# Patient Record
Sex: Male | Born: 1970 | Race: White | Hispanic: No | Marital: Single | Smoking: Current every day smoker
Health system: Southern US, Community
[De-identification: ages and names within clinical notes are randomized; demographics above are authoritative.]

## PROBLEM LIST (undated history)

## (undated) DIAGNOSIS — F209 Schizophrenia, unspecified: Secondary | ICD-10-CM

## (undated) DIAGNOSIS — B1910 Unspecified viral hepatitis B without hepatic coma: Secondary | ICD-10-CM

## (undated) DIAGNOSIS — M419 Scoliosis, unspecified: Secondary | ICD-10-CM

## (undated) DIAGNOSIS — I10 Essential (primary) hypertension: Secondary | ICD-10-CM

## (undated) DIAGNOSIS — G894 Chronic pain syndrome: Secondary | ICD-10-CM

## (undated) DIAGNOSIS — G40909 Epilepsy, unspecified, not intractable, without status epilepticus: Secondary | ICD-10-CM

## (undated) DIAGNOSIS — K7460 Unspecified cirrhosis of liver: Secondary | ICD-10-CM

## (undated) DIAGNOSIS — M4300 Spondylolysis, site unspecified: Secondary | ICD-10-CM

## (undated) HISTORY — PX: MANDIBLE FRACTURE SURGERY: SHX706

## (undated) HISTORY — DX: Unspecified cirrhosis of liver: K74.60

## (undated) HISTORY — DX: Scoliosis, unspecified: M41.9

## (undated) HISTORY — DX: Unspecified viral hepatitis B without hepatic coma: B19.10

## (undated) HISTORY — DX: Essential (primary) hypertension: I10

## (undated) HISTORY — DX: Chronic pain syndrome: G89.4

## (undated) HISTORY — DX: Epilepsy, unspecified, not intractable, without status epilepticus: G40.909

## (undated) HISTORY — DX: Spondylolysis, site unspecified: M43.00

## (undated) HISTORY — DX: Schizophrenia, unspecified: F20.9

---

## 2016-05-25 ENCOUNTER — Other Ambulatory Visit (HOSPITAL_COMMUNITY): Payer: Self-pay | Admitting: Nurse Practitioner

## 2016-05-25 DIAGNOSIS — B181 Chronic viral hepatitis B without delta-agent: Secondary | ICD-10-CM

## 2016-05-31 ENCOUNTER — Encounter: Payer: Self-pay | Admitting: Gastroenterology

## 2016-07-12 ENCOUNTER — Ambulatory Visit (HOSPITAL_COMMUNITY): Payer: Medicare Other

## 2016-08-02 ENCOUNTER — Ambulatory Visit: Payer: Self-pay | Admitting: Gastroenterology

## 2017-06-29 ENCOUNTER — Emergency Department (HOSPITAL_COMMUNITY)
Admission: EM | Admit: 2017-06-29 | Discharge: 2017-06-30 | Disposition: A | Discharge status: Other Acute Inpatient Hospital | Payer: Self-pay | Attending: Emergency Medicine | Admitting: Emergency Medicine

## 2017-06-29 ENCOUNTER — Emergency Department (HOSPITAL_COMMUNITY): Payer: Self-pay

## 2017-06-29 ENCOUNTER — Encounter (HOSPITAL_COMMUNITY): Payer: Self-pay | Admitting: Emergency Medicine

## 2017-06-29 DIAGNOSIS — Z046 Encounter for general psychiatric examination, requested by authority: Secondary | ICD-10-CM | POA: Insufficient documentation

## 2017-06-29 DIAGNOSIS — Z79899 Other long term (current) drug therapy: Secondary | ICD-10-CM | POA: Insufficient documentation

## 2017-06-29 DIAGNOSIS — R42 Dizziness and giddiness: Secondary | ICD-10-CM | POA: Insufficient documentation

## 2017-06-29 DIAGNOSIS — R51 Headache: Secondary | ICD-10-CM | POA: Insufficient documentation

## 2017-06-29 DIAGNOSIS — I10 Essential (primary) hypertension: Secondary | ICD-10-CM | POA: Insufficient documentation

## 2017-06-29 DIAGNOSIS — R450 Nervousness: Secondary | ICD-10-CM | POA: Insufficient documentation

## 2017-06-29 DIAGNOSIS — F17210 Nicotine dependence, cigarettes, uncomplicated: Secondary | ICD-10-CM | POA: Insufficient documentation

## 2017-06-29 DIAGNOSIS — R41840 Attention and concentration deficit: Secondary | ICD-10-CM | POA: Insufficient documentation

## 2017-06-29 DIAGNOSIS — R45851 Suicidal ideations: Secondary | ICD-10-CM | POA: Insufficient documentation

## 2017-06-29 LAB — COMPREHENSIVE METABOLIC PANEL
ALBUMIN: 3.7 g/dL (ref 3.5–5.0)
ALT: 29 U/L (ref 17–63)
ANION GAP: 6 (ref 5–15)
AST: 38 U/L (ref 15–41)
Alkaline Phosphatase: 91 U/L (ref 38–126)
BILIRUBIN TOTAL: 0.9 mg/dL (ref 0.3–1.2)
BUN: 10 mg/dL (ref 6–20)
CHLORIDE: 107 mmol/L (ref 101–111)
CO2: 25 mmol/L (ref 22–32)
Calcium: 9.2 mg/dL (ref 8.9–10.3)
Creatinine, Ser: 0.72 mg/dL (ref 0.61–1.24)
GFR calc non Af Amer: >60 mL/min (ref >60)
GLUCOSE: 120 mg/dL — AB (ref 65–99)
Potassium: 3.3 mmol/L — ABNORMAL LOW (ref 3.5–5.1)
SODIUM: 138 mmol/L (ref 135–145)
Total Protein: 6.6 g/dL (ref 6.5–8.1)

## 2017-06-29 LAB — URINALYSIS, ROUTINE W REFLEX MICROSCOPIC
BILIRUBIN URINE: NEGATIVE
GLUCOSE, UA: NEGATIVE mg/dL
HGB URINE DIPSTICK: NEGATIVE
KETONES UR: NEGATIVE mg/dL
Leukocytes, UA: NEGATIVE
Nitrite: NEGATIVE
PROTEIN: NEGATIVE mg/dL
Specific Gravity, Urine: 1.012 (ref 1.005–1.030)
pH: 6 (ref 5.0–8.0)

## 2017-06-29 LAB — RAPID URINE DRUG SCREEN, HOSP PERFORMED
AMPHETAMINES: NOT DETECTED
BARBITURATES: NOT DETECTED
BENZODIAZEPINES: NOT DETECTED
COCAINE: NOT DETECTED
Opiates: NOT DETECTED
TETRAHYDROCANNABINOL: NOT DETECTED

## 2017-06-29 LAB — AMMONIA: Ammonia: 60 umol/L — ABNORMAL HIGH (ref 9–35)

## 2017-06-29 NOTE — ED Triage Notes (Signed)
Pt states he has been having suicidal thoughts x 2 days. His plan is to take a bunch of pill or cut his wrists.
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
## 2017-06-30 LAB — CBC WITH DIFFERENTIAL/PLATELET
BASOS PCT: 0 %
Basophils Absolute: 0 10*3/uL (ref 0.0–0.1)
EOS PCT: 2 %
Eosinophils Absolute: 0.1 10*3/uL (ref 0.0–0.7)
HEMATOCRIT: 44.9 % (ref 39.0–52.0)
Hemoglobin: 15.9 g/dL (ref 13.0–17.0)
Lymphocytes Relative: 34 %
Lymphs Abs: 1.5 10*3/uL (ref 0.7–4.0)
MCH: 31.2 pg (ref 26.0–34.0)
MCHC: 35.4 g/dL (ref 30.0–36.0)
MCV: 88 fL (ref 78.0–100.0)
MONOS PCT: 8 %
Monocytes Absolute: 0.4 10*3/uL (ref 0.1–1.0)
NEUTROS ABS: 2.4 10*3/uL (ref 1.7–7.7)
Neutrophils Relative %: 56 %
PLATELETS: 61 10*3/uL — AB (ref 150–400)
RBC: 5.1 MIL/uL (ref 4.22–5.81)
RDW: 13.3 % (ref 11.5–15.5)
WBC: 4.4 10*3/uL (ref 4.0–10.5)

## 2017-06-30 LAB — ETHANOL

## 2017-06-30 MED ORDER — MIRTAZAPINE 15 MG PO TABS
15.0000 mg | ORAL_TABLET | Freq: Every day | ORAL | Status: DC
  Filled 2017-06-30 (×3): qty 1

## 2017-06-30 MED ORDER — LISINOPRIL 10 MG PO TABS: 10.0000 mg | ORAL_TABLET | Freq: Every day | ORAL | Status: DC

## 2017-06-30 MED ORDER — RISPERIDONE 1 MG PO TABS
3.0000 mg | ORAL_TABLET | Freq: Two times a day (BID) | ORAL | Status: DC
  Filled 2017-06-30 (×5): qty 3
  Filled 2017-06-30: qty 1
  Filled 2017-06-30: qty 3

## 2017-06-30 MED ORDER — GABAPENTIN 400 MG PO CAPS: 400.0000 mg | ORAL_CAPSULE | Freq: Four times a day (QID) | ORAL | Status: DC

## 2017-06-30 MED ORDER — NICOTINE 21 MG/24HR TD PT24
21.0000 mg | MEDICATED_PATCH | Freq: Once | TRANSDERMAL | Status: DC
  Administered 2017-06-30: 21 mg via TRANSDERMAL
  Filled 2017-06-30: qty 1

## 2017-06-30 MED ORDER — LACTULOSE 10 GM/15ML PO SOLN: 10.0000 g | Freq: Three times a day (TID) | ORAL | Status: DC

## 2017-06-30 MED ORDER — ENTECAVIR 1 MG PO TABS
1.0000 mg | ORAL_TABLET | Freq: Every day | ORAL | Status: DC
  Filled 2017-06-30 (×4): qty 1

## 2017-06-30 NOTE — Progress Notes (Signed)
[facility] been [facility]. Accepting and attending is Dr. Laveda Abbehomas Sneed.   Number for report is [phone]. Patient can transport at anytime, bed is available.    Neil CrouchJJ Crews, RN notified.   Baldo DaubJolan Gahel Safley MSW, LCSWA CSW Disposition [phone]
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
## 2017-06-30 NOTE — ED Provider Notes (Signed)
[facility]. Will transfer stable.    Samuel JesterMcManus, Ab Leaming, DO 06/30/17 [phone]

## 2017-06-30 NOTE — [facility] Counselor (Signed)
Referrals for inpatient treatment sent to:  [facility]: [facility], [facility], [address], [address] [address] [facility], [facility] [place], [facility], [address], [facility], [address], [facility], [facility] [address], [place], [facility], [address], [place], [place], [facility], [facility], [facility], [place], [facility] [place], [address], [facility], [place], [place], [facility], [place] [facility], [facility], and [facility].

## 2018-11-10 IMAGING — CT CT HEAD W/O CM
3 series · 16 of 47 positions shown, 19 images · non-contrast
Comparison: None.

CLINICAL DATA: Acute onset of suicidal ideation. Altered mental
status.

EXAM:
CT HEAD WITHOUT CONTRAST
TECHNIQUE: Contiguous axial images were obtained from the base of the skull
through the vertex without intravenous contrast.

[Series 2: head trauma wo · axial · 0.44mm/px · z∈[+1452,+1597]mm · 10 of 35 slices shown, 13 images]
[im 3/35  brain]
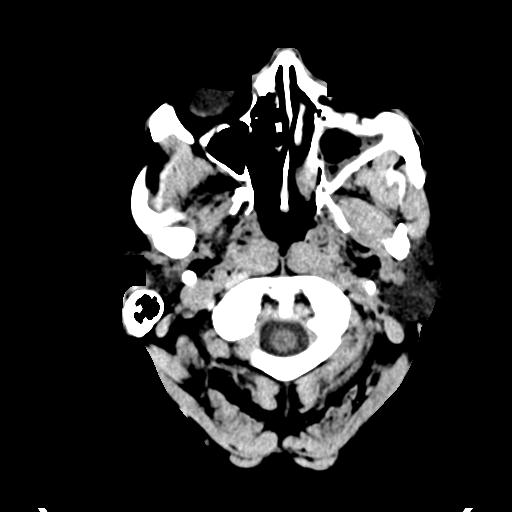
[im 3/35  bone]
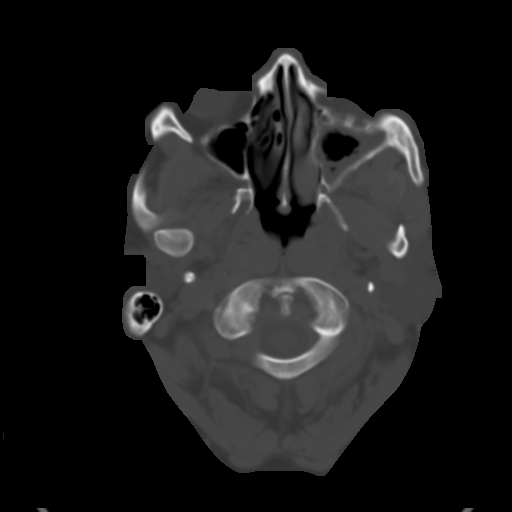
[im 6/35  brain]
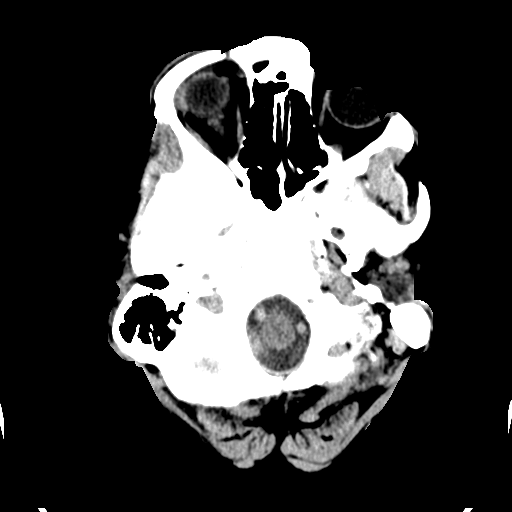
[im 10/35  brain]
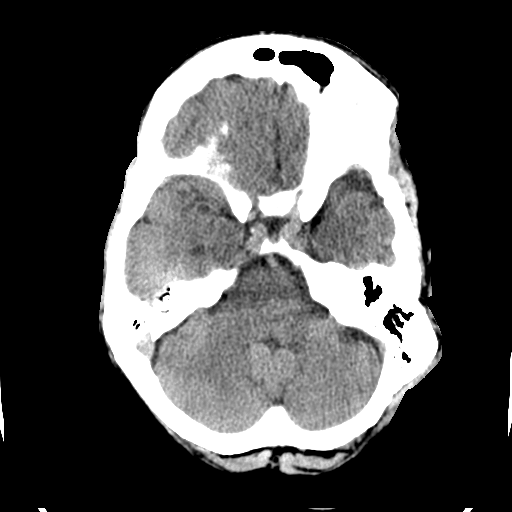
[im 12/35  brain]
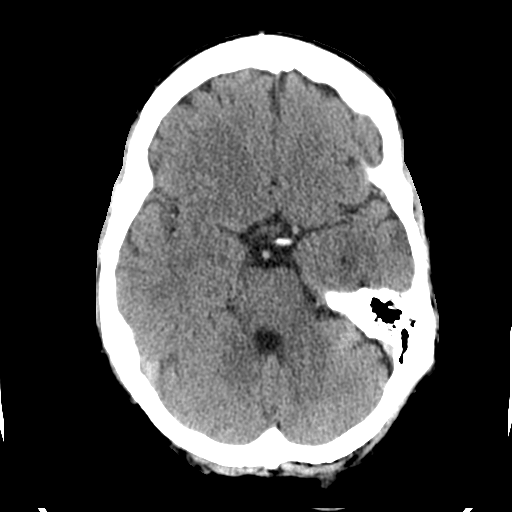
[im 16/35  brain]
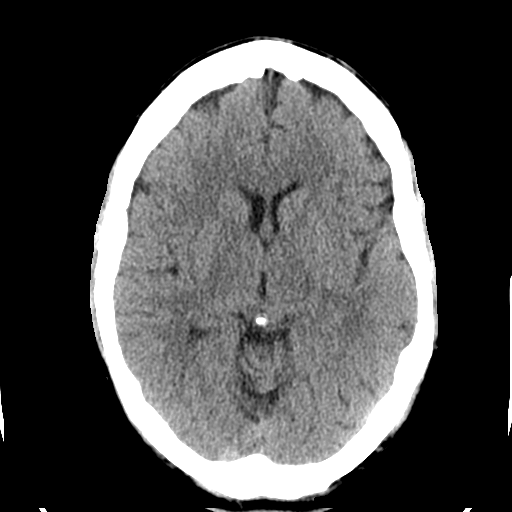
[im 16/35  bone]
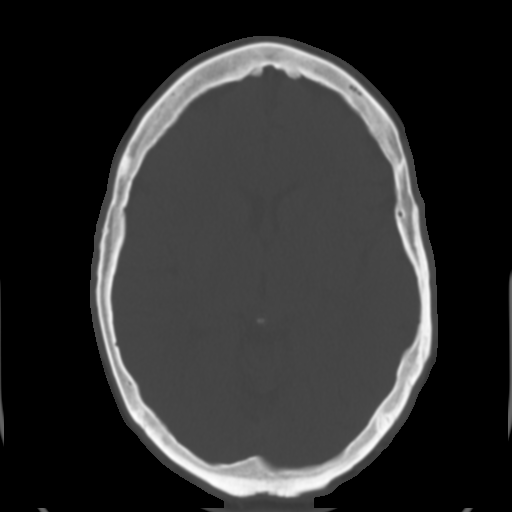
[im 19/35  brain]
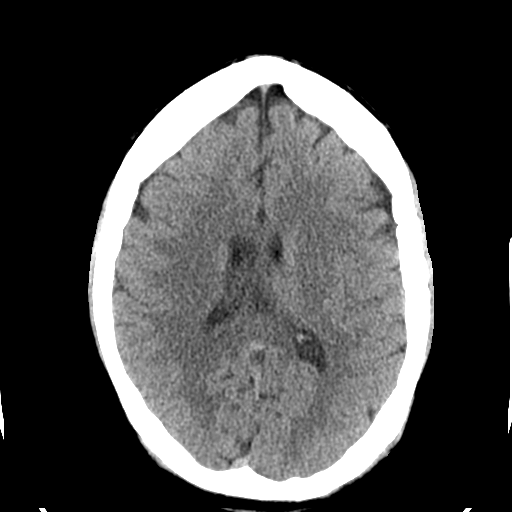
[im 23/35  brain]
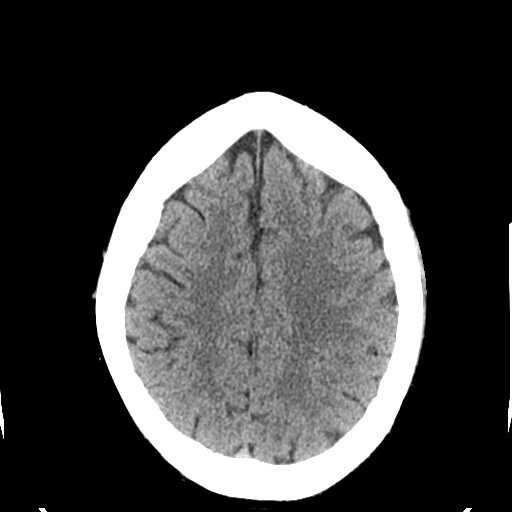
[im 26/35  brain]
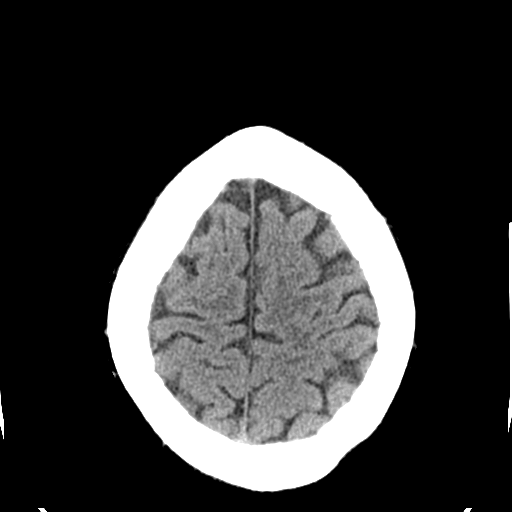
[im 29/35  brain]
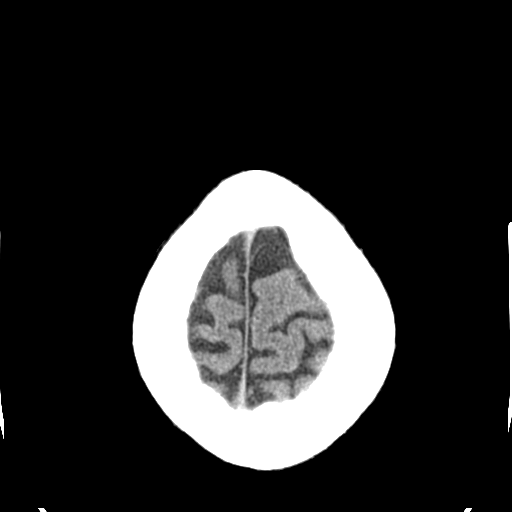
[im 29/35  bone]
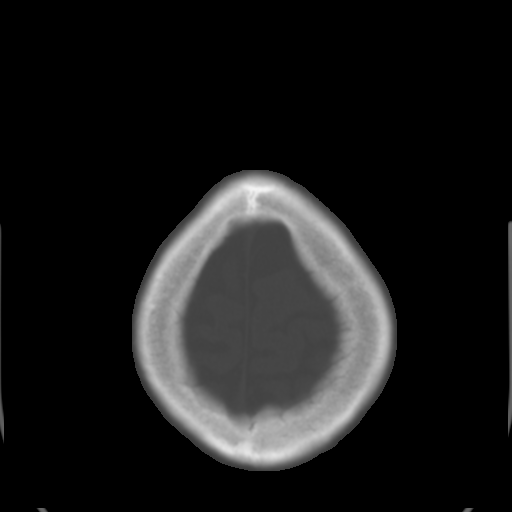
[im 32/35  brain]
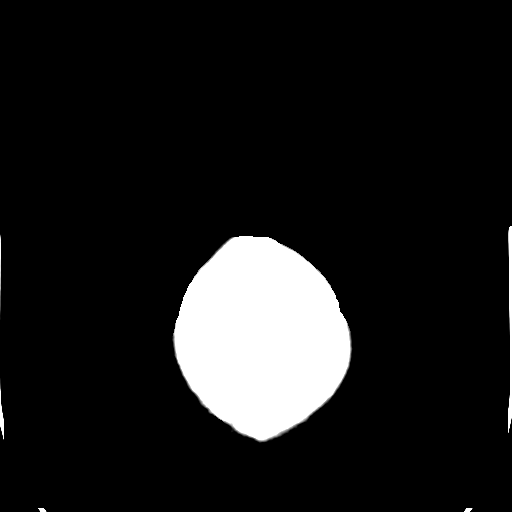

[Series 4: coronal soft tissue · coronal · 0.33mm/px · 3 of 72 slices shown]
[im 24/72  brain]
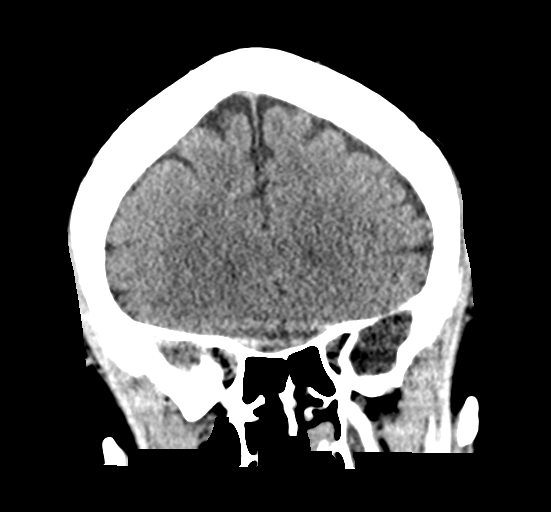
[im 32/72  brain]
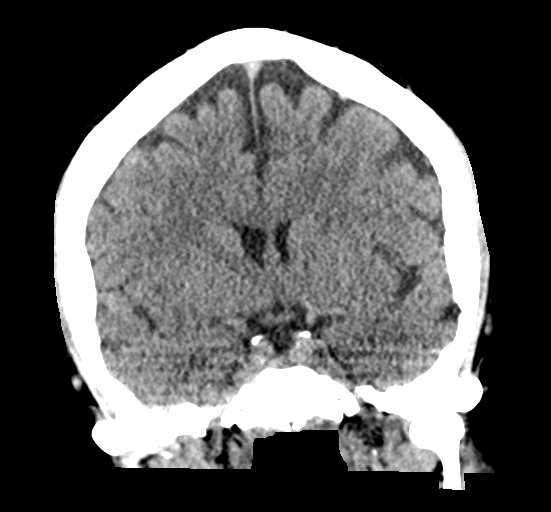
[im 40/72  brain]
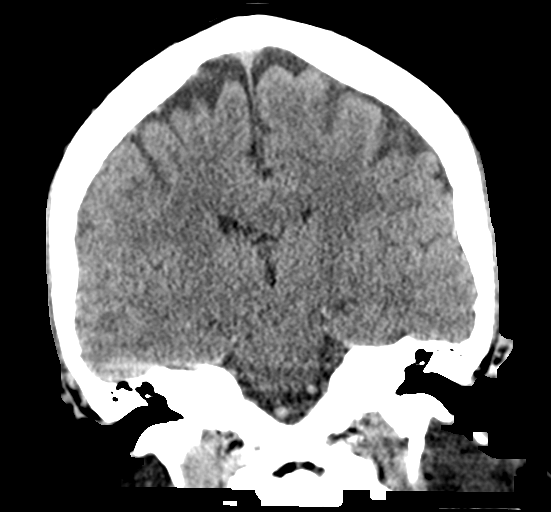

[Series 5: sagittal soft tissue · sagittal · 0.37mm/px · 3 of 59 slices shown]
[im 21/59  brain]
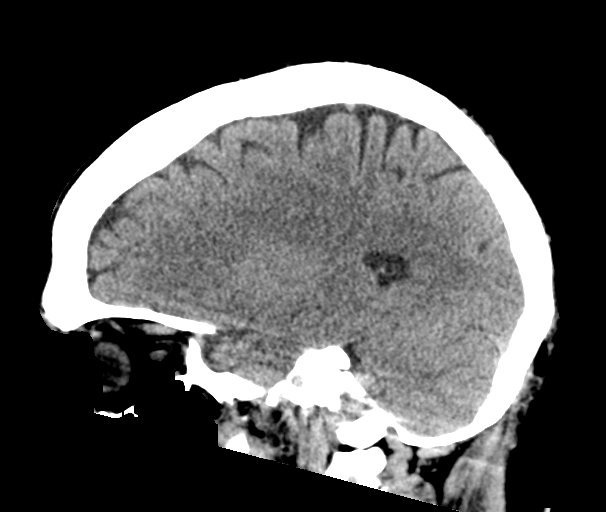
[im 30/59  brain]
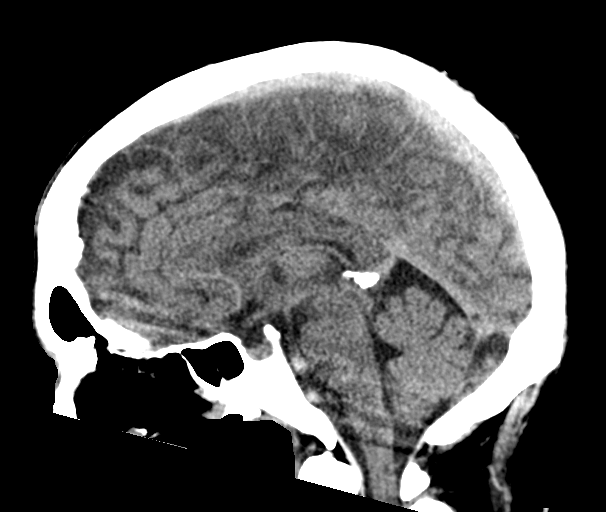
[im 38/59  brain]
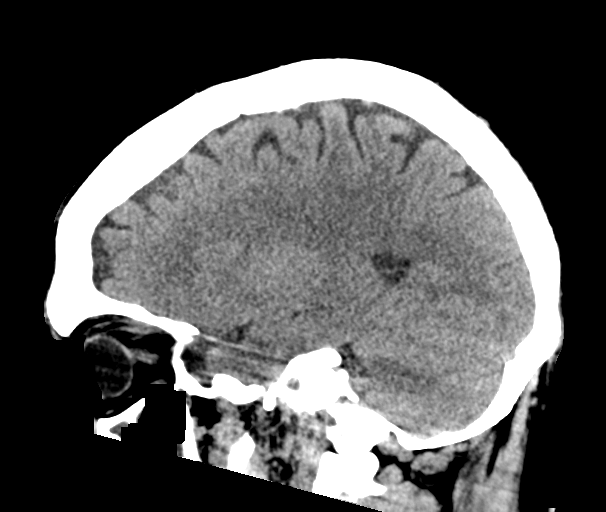

[16 of 47 positions shown; findings below may reference images not displayed]

FINDINGS: Brain: No evidence of acute infarction, hemorrhage, hydrocephalus,
extra-axial collection or mass lesion/mass effect.

The posterior fossa, including the cerebellum, brainstem and fourth
ventricle, is within normal limits. The third and lateral
ventricles, and basal ganglia are unremarkable in appearance. The
cerebral hemispheres are symmetric in appearance, with normal
gray-white differentiation. No mass effect or midline shift is seen.

Vascular: No hyperdense vessel or unexpected calcification.

Skull: There is no evidence of fracture; visualized osseous
structures are unremarkable in appearance.

Sinuses/Orbits: The orbits are within normal limits. Mucosal
thickening is noted at the left maxillary sinus. The remaining
paranasal sinuses and mastoid air cells are well-aerated.

Other: No significant soft tissue abnormalities are seen.
IMPRESSION: 1. No acute intracranial pathology seen on CT.
2. Mucosal thickening at the left maxillary sinus.

## 2020-11-10 ENCOUNTER — Encounter (INDEPENDENT_AMBULATORY_CARE_PROVIDER_SITE_OTHER): Payer: Self-pay | Admitting: *Deleted

## 2021-02-18 ENCOUNTER — Ambulatory Visit (INDEPENDENT_AMBULATORY_CARE_PROVIDER_SITE_OTHER): Payer: Medicare Other | Admitting: Gastroenterology

## 2021-02-18 ENCOUNTER — Encounter (INDEPENDENT_AMBULATORY_CARE_PROVIDER_SITE_OTHER): Payer: Self-pay | Admitting: Gastroenterology

## 2021-02-18 ENCOUNTER — Encounter (INDEPENDENT_AMBULATORY_CARE_PROVIDER_SITE_OTHER): Payer: Self-pay | Admitting: *Deleted

## 2021-02-18 ENCOUNTER — Telehealth (INDEPENDENT_AMBULATORY_CARE_PROVIDER_SITE_OTHER): Payer: Self-pay

## 2021-02-18 NOTE — Telephone Encounter (Signed)
Patient no showed for his GI Appointment with Dr. Katrinka Blazing Mayorga on 02/09/2021 at [facility].

## 2021-06-28 ENCOUNTER — Encounter (INDEPENDENT_AMBULATORY_CARE_PROVIDER_SITE_OTHER): Payer: Self-pay | Admitting: Gastroenterology

## 2021-06-28 ENCOUNTER — Ambulatory Visit (INDEPENDENT_AMBULATORY_CARE_PROVIDER_SITE_OTHER): Payer: Medicare Other | Admitting: Gastroenterology

## 2021-06-29 ENCOUNTER — Encounter (INDEPENDENT_AMBULATORY_CARE_PROVIDER_SITE_OTHER): Payer: Self-pay | Admitting: *Deleted
